# Patient Record
Sex: Female | Born: 1995 | Race: White | Hispanic: No | Marital: Single | Smoking: Never smoker
Health system: Southern US, Community
[De-identification: ages and names within clinical notes are randomized; demographics above are authoritative.]

## PROBLEM LIST (undated history)

## (undated) DIAGNOSIS — L309 Dermatitis, unspecified: Secondary | ICD-10-CM

---
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
## 2017-03-23 ENCOUNTER — Ambulatory Visit (INDEPENDENT_AMBULATORY_CARE_PROVIDER_SITE_OTHER): Payer: BLUE CROSS/BLUE SHIELD | Admitting: Family Medicine

## 2017-03-23 ENCOUNTER — Ambulatory Visit (FREE_STANDING_LABORATORY_FACILITY): Payer: BLUE CROSS/BLUE SHIELD | Admitting: Family Medicine

## 2017-03-23 ENCOUNTER — Encounter (INDEPENDENT_AMBULATORY_CARE_PROVIDER_SITE_OTHER): Payer: Self-pay | Admitting: Family Medicine

## 2017-03-23 VITALS — BP 112/72 | HR 73 | Temp 97.7°F | Ht 66.1 in | Wt 111.0 lb

## 2017-03-23 DIAGNOSIS — Z1389 Encounter for screening for other disorder: Secondary | ICD-10-CM

## 2017-03-23 DIAGNOSIS — Z0000 Encounter for general adult medical examination without abnormal findings: Secondary | ICD-10-CM

## 2017-03-23 DIAGNOSIS — Z1331 Encounter for screening for depression: Secondary | ICD-10-CM

## 2017-03-23 LAB — COMPREHENSIVE METABOLIC PANEL
ALT: 11 U/L (ref 0–55)
AST (SGOT): 17 U/L (ref 5–34)
Albumin/Globulin Ratio: 1.6 (ref 0.9–2.2)
Albumin: 5.1 g/dL — ABNORMAL HIGH (ref 3.5–5.0)
Alkaline Phosphatase: 84 U/L (ref 50–130)
BUN: 12 mg/dL (ref 7.0–19.0)
Bilirubin, Total: 0.5 mg/dL (ref 0.1–1.2)
CO2: 26 mEq/L (ref 21–29)
Calcium: 10.8 mg/dL — ABNORMAL HIGH (ref 8.5–10.5)
Chloride: 106 mEq/L (ref 100–111)
Creatinine: 0.8 mg/dL (ref 0.4–1.5)
Globulin: 3.1 g/dL (ref 2.0–3.7)
Glucose: 91 mg/dL (ref 70–100)
Potassium: 4.7 mEq/L (ref 3.5–5.1)
Protein, Total: 8.2 g/dL (ref 6.0–8.3)
Sodium: 142 mEq/L (ref 136–145)

## 2017-03-23 LAB — CBC AND DIFFERENTIAL
Absolute NRBC: 0 10*3/uL
Basophils Absolute Automated: 0.03 10*3/uL (ref 0.00–0.20)
Basophils Automated: 0.6 %
Eosinophils Absolute Automated: 0.07 10*3/uL (ref 0.00–0.70)
Eosinophils Automated: 1.5 %
Hematocrit: 47.6 % — ABNORMAL HIGH (ref 37.0–47.0)
Hgb: 15 g/dL (ref 12.0–16.0)
Immature Granulocytes Absolute: 0.01 10*3/uL
Immature Granulocytes: 0.2 %
Lymphocytes Absolute Automated: 1.46 10*3/uL (ref 0.50–4.40)
Lymphocytes Automated: 30.3 %
MCH: 28.7 pg (ref 28.0–32.0)
MCHC: 31.5 g/dL — ABNORMAL LOW (ref 32.0–36.0)
MCV: 91 fL (ref 80.0–100.0)
MPV: 11.9 fL (ref 9.4–12.3)
Monocytes Absolute Automated: 0.31 10*3/uL (ref 0.00–1.20)
Monocytes: 6.4 %
Neutrophils Absolute: 2.94 10*3/uL (ref 1.80–8.10)
Neutrophils: 61 %
Nucleated RBC: 0 /100 WBC (ref 0.0–1.0)
Platelets: 247 10*3/uL (ref 140–400)
RBC: 5.23 10*6/uL (ref 4.20–5.40)
RDW: 13 % (ref 12–15)
WBC: 4.82 10*3/uL (ref 3.50–10.80)

## 2017-03-23 LAB — LIPID PANEL
Cholesterol / HDL Ratio: 2.3
Cholesterol: 152 mg/dL (ref 0–199)
HDL: 65 mg/dL (ref 40–9999)
LDL Calculated: 76 mg/dL (ref 0–99)
Triglycerides: 55 mg/dL (ref 34–149)
VLDL Calculated: 11 mg/dL (ref 10–40)

## 2017-03-23 LAB — VITAMIN D,25 OH,TOTAL: Vitamin D, 25 OH, Total: 22 ng/mL — ABNORMAL LOW (ref 30–100)

## 2017-03-23 LAB — TSH: TSH: 1.07 u[IU]/mL (ref 0.35–4.94)

## 2017-03-23 LAB — HEMOLYSIS INDEX: Hemolysis Index: 18 (ref 0–18)

## 2017-03-23 NOTE — Progress Notes (Signed)
Have you seen any specialists/other providers since your last visit with us?    Yes    Arm preference verified?   Yes    The patient is due for  HPV.

## 2017-03-23 NOTE — Progress Notes (Signed)
Subjective:       Patient ID: Toni Duffy is a 21 y.o. female.    HPI  Pt presents to clinic for a CPE.  Pt has no concerns today.Toni Duffy eats a balanced diet .    The following portions of the patient's history were reviewed and updated as appropriate: allergies, current medications, past family history, past medical history, past social history, past surgical history and problem list.    Review of Systems   Constitutional: Negative for activity change, appetite change, fatigue and unexpected weight change.   HENT: Negative for congestion, postnasal drip and rhinorrhea.    Eyes: Negative for discharge and redness.   Respiratory: Negative for cough, shortness of breath and wheezing.    Cardiovascular: Negative for chest pain and palpitations.   Gastrointestinal: Negative for abdominal pain, constipation, diarrhea and vomiting.   Genitourinary: Negative for difficulty urinating, dysuria, vaginal bleeding and vaginal discharge.   Musculoskeletal: Negative for arthralgias and back pain.   Skin: Negative for rash.   Neurological: Negative for weakness, numbness and headaches.   Psychiatric/Behavioral: Negative for agitation, confusion and decreased concentration. The patient is not nervous/anxious.            Objective:    Physical Exam   Constitutional: She is oriented to person, place, and time. She appears well-developed and well-nourished.   HENT:   Head: Normocephalic and atraumatic.   Right Ear: External ear normal.   Left Ear: External ear normal.   Nose: Nose normal.   Mouth/Throat: Oropharynx is clear and moist.   Eyes: Conjunctivae and EOM are normal. Pupils are equal, round, and reactive to light.   Neck: Normal range of motion. Neck supple. No thyromegaly present.   Cardiovascular: Normal rate, regular rhythm, normal heart sounds and intact distal pulses.    Pulmonary/Chest: Effort normal and breath sounds normal.   Abdominal: Soft. Bowel sounds are normal. She exhibits no distension and no mass.  There is no tenderness.   Musculoskeletal: Normal range of motion. She exhibits no edema or tenderness.   Lymphadenopathy:     She has no cervical adenopathy.   Neurological: She is alert and oriented to person, place, and time. She has normal reflexes. She displays normal reflexes. No cranial nerve deficit. She exhibits normal muscle tone. Coordination normal.   Skin: Skin is warm and dry.   Psychiatric: She has a normal mood and affect. Her behavior is normal. Judgment and thought content normal.   Vitals reviewed.          Assessment:       1. Depression screening     2. Annual physical exam  Varicella vaccine subcutaneous    HPV 9 valent recombinant IM    Meningococcal conjugate vaccine 4-valent IM    CBC and differential    Comprehensive metabolic panel    TSH    Lipid panel    Vitamin D,25 OH, Total           Plan:      Procedures  Orders Placed This Encounter   Procedures   . Varicella vaccine subcutaneous   . HPV 9 valent recombinant IM   . Meningococcal conjugate vaccine 4-valent IM   . CBC and differential   . Comprehensive metabolic panel     Order Specific Question:   Has the patient fasted?     Answer:   Yes   . TSH   . Lipid panel     Order Specific Question:  Has the patient fasted?     Answer:   Yes   . Vitamin D,25 OH, Total   . Hemolysis index     Has the patient fasted?->Yes       Nutrition: Stressed importance of moderation in sodium/caffeine intake, saturated fat and cholesterol, caloric balance, sufficient intake of fresh fruits, vegetables, fiber, calcium, iron   Body mass index is 17.86 kg/m.  Discussed the patient's BMI with her.  The BMI is in the acceptable range. As per patient not losing any more weight, no loss of appetite, no abdominal pain    Also seen GI in the past for weight loss and as per patient was t old everything was normal, will get records.  Nutrition and Physical Activity Counseling:  Nutrition Counseling:   Food education, guidance and counseling  Physical Activity  Counseling   Patient advised about exercise      --Discussed the issue of calcium supplement, --Exercise: Stressed the importance of regular exercise, 30 minutes daily 5 times per week.   --Dental health: Discussed importance of regular tooth brushing, flossing, and dental visits.   --Immunizations reviewed.  Orders Placed This Encounter   Procedures   . Varicella vaccine subcutaneous   . HPV 9 valent recombinant IM   . Meningococcal conjugate vaccine 4-valent IM   . CBC and differential   . Comprehensive metabolic panel     Order Specific Question:   Has the patient fasted?     Answer:   Yes   . TSH   . Lipid panel     Order Specific Question:   Has the patient fasted?     Answer:   Yes   . Vitamin D,25 OH, Total   . Hemolysis index     Has the patient fasted?->Yes

## 2017-03-24 NOTE — Progress Notes (Signed)
Vitamin d low advise OTC vitamin d3-1000 IU daily.  Also calcium slightly high increase water intake to 6-8 glasses daily and repeat calcium in 2 months

## 2017-03-26 ENCOUNTER — Encounter (INDEPENDENT_AMBULATORY_CARE_PROVIDER_SITE_OTHER): Payer: Self-pay | Admitting: Family Medicine

## 2017-03-26 NOTE — Progress Notes (Signed)
I

## 2017-04-07 ENCOUNTER — Encounter (INDEPENDENT_AMBULATORY_CARE_PROVIDER_SITE_OTHER): Payer: Self-pay | Admitting: Family Medicine

## 2020-10-21 ENCOUNTER — Other Ambulatory Visit: Payer: Self-pay

## 2020-10-21 ENCOUNTER — Encounter: Payer: Self-pay | Admitting: Emergency Medicine

## 2020-10-21 ENCOUNTER — Emergency Department (INDEPENDENT_AMBULATORY_CARE_PROVIDER_SITE_OTHER)
Admission: EM | Admit: 2020-10-21 | Discharge: 2020-10-21 | Disposition: A | Discharge status: Home / Self Care | Payer: Federal, State, Local not specified - PPO | Source: Home / Self Care

## 2020-10-21 DIAGNOSIS — R21 Rash and other nonspecific skin eruption: Secondary | ICD-10-CM | POA: Diagnosis not present

## 2020-10-21 MED ORDER — CETIRIZINE HCL 10 MG PO TABS: 10.0000 mg | ORAL_TABLET | Freq: Every day | ORAL | 0 refills | Status: AC

## 2020-10-21 MED ORDER — PREDNISONE 20 MG PO TABS: ORAL_TABLET | ORAL | 0 refills | Status: DC

## 2020-10-21 NOTE — Discharge Instructions (Signed)
  Avoid using makeup and only use mild non-fragrance soap for your face.   Take medication as prescribed. Call to schedule a follow up with family medicine or dermatologist later in the week if not improving.

## 2020-10-21 NOTE — ED Provider Notes (Signed)
[facility]    CSN: [id number] Arrival date & time: 10/21/20  1301      History   Chief Complaint Chief Complaint  Patient presents with  . Rash    HPI Morgan Castaneda is a 25 y.o. female.   HPI Morgan Castaneda is a 25 y.o. female presenting to UC with c/o itchy burning red rash that has been waxing and waning for a few months around her eyes and lips.  She also has a red lesion that comes and goes on her forehead. She has tried several different facial lotions without relief. She has not tried any medications. Denies oral swelling or trouble breathing. No new soaps, lotions or makeup she recalls prior to symptom onset. No other rashes on her body.   History reviewed. No pertinent past medical history.  There are no problems to display for this patient.   History reviewed. No pertinent surgical history.  OB History   No obstetric history on file.      Home Medications    Prior to Admission medications   Medication Sig Start Date End Date Taking? Authorizing Provider  cetirizine (ZYRTEC) 10 MG tablet Take 1 tablet (10 mg total) by mouth daily. 10/21/20  Yes Lurene Shadow, PA-C  predniSONE (DELTASONE) 20 MG tablet 3 tabs po day one, then 2 po daily x 4 days 10/21/20  Yes Lurene Shadow, PA-C    Family History Family History  Problem Relation Age of Onset  . Healthy Mother   . Lung disease Father   . Glaucoma Father   . Healthy Brother     Social History Social History   Tobacco Use  . Smoking status: Never Smoker  . Smokeless tobacco: Never Used  Vaping Use  . Vaping Use: Never used  Substance Use Topics  . Alcohol use: Not Currently  . Drug use: Never     Allergies   Patient has no known allergies.   Review of Systems Review of Systems  Constitutional: Negative for chills and fever.  HENT: Positive for facial swelling (mild eyelids).   Eyes: Negative for pain, discharge, itching and visual disturbance.  Skin: Positive for color change  and rash. Negative for wound.  Neurological: Negative for headaches.     Physical Exam Triage Vital Signs ED Triage Vitals  Enc Vitals Group     BP 10/21/20 1400 118/87     Pulse Rate 10/21/20 1400 97     Resp 10/21/20 1400 16     Temp 10/21/20 1400 99 F (37.2 C)     Temp Source 10/21/20 1400 Oral     SpO2 10/21/20 1400 99 %     Weight 10/21/20 1404 110 lb (49.9 kg)     Height 10/21/20 1404 5\' 7"  (1.702 m)     Head Circumference --      Peak Flow --      Pain Score 10/21/20 1402 0     Pain Loc --      Pain Edu? --      Excl. in GC? --    No data found.  Updated Vital Signs BP 118/87 (BP Location: Right Arm)   Pulse 97   Temp 99 F (37.2 C) (Oral)   Resp 16   Ht 5\' 7"  (1.702 m)   Wt 110 lb (49.9 kg)   LMP 10/01/2020 (Exact Date)   SpO2 99%   BMI 17.23 kg/m   Visual Acuity Right Eye Distance: 20/20 Left Eye Distance: 20/20 Bilateral  Distance: 20/20  Right Eye Near:   Left Eye Near:    Bilateral Near:     Physical Exam Vitals and nursing note reviewed.  Constitutional:      General: She is not in acute distress.    Appearance: Normal appearance. She is well-developed and well-nourished. She is not ill-appearing, toxic-appearing or diaphoretic.  HENT:     Head: Normocephalic and atraumatic.      Right Ear: Tympanic membrane and ear canal normal.     Left Ear: Tympanic membrane and ear canal normal.     Nose: Nose normal.     Right Sinus: No maxillary sinus tenderness or frontal sinus tenderness.     Left Sinus: No maxillary sinus tenderness or frontal sinus tenderness.     Mouth/Throat:     Lips: Pink.     Mouth: Mucous membranes are moist.     Pharynx: Oropharynx is clear. Uvula midline. No pharyngeal swelling, oropharyngeal exudate, posterior oropharyngeal erythema or uvula swelling.  Eyes:     Extraocular Movements: Extraocular movements intact and EOM normal.     Conjunctiva/sclera: Conjunctivae normal.     Pupils: Pupils are equal, round, and  reactive to light.  Cardiovascular:     Rate and Rhythm: Normal rate.  Pulmonary:     Effort: Pulmonary effort is normal. No respiratory distress.  Musculoskeletal:        General: Normal range of motion.     Cervical back: Normal range of motion.  Skin:    General: Skin is warm and dry.     Findings: Rash (facial only) present.  Neurological:     Mental Status: She is alert and oriented to person, place, and time.  Psychiatric:        Mood and Affect: Mood and affect normal.        Behavior: Behavior normal.      UC Treatments / Results  Labs (all labs ordered are listed, but only abnormal results are displayed) Labs Reviewed - No data to display  EKG   Radiology No results found.  Procedures Procedures (including critical care time)  Medications Ordered in UC Medications - No data to display  Initial Impression / Assessment and Plan / UC Course  I have reviewed the triage vital signs and the nursing notes.  Pertinent labs & imaging results that were available during my care of the patient were reviewed by me and considered in my medical decision making (see chart for details).     Chronic itchy burning red facial rash Will tx for allergic reaction Encouraged f/u with PCP and/or dermatology later in the week if medication not improving.   Final Clinical Impressions(s) / UC Diagnoses   Final diagnoses:  Rash and nonspecific skin eruption     Discharge Instructions      Avoid using makeup and only use mild non-fragrance soap for your face.   Take medication as prescribed. Call to schedule a follow up with family medicine or dermatologist later in the week if not improving.    ED Prescriptions    Medication Sig Dispense Auth. Provider   predniSONE (DELTASONE) 20 MG tablet 3 tabs po day one, then 2 po daily x 4 days 11 tablet Jeronica Stlouis O, PA-C   cetirizine (ZYRTEC) 10 MG tablet Take 1 tablet (10 mg total) by mouth daily. 30 tablet Lurene Shadow,  [place]     PDMP not reviewed this encounter.   Lurene Shadow, [place] 10/21/20 1539

## 2020-10-21 NOTE — ED Triage Notes (Signed)
Rash to face & eyelids over the last few months No new lotions, detergent, foods or animals  Pt does not wear make up  [employer] vaccine July 2021

## 2020-12-30 ENCOUNTER — Emergency Department (HOSPITAL_BASED_OUTPATIENT_CLINIC_OR_DEPARTMENT_OTHER)
Admission: EM | Admit: 2020-12-30 | Discharge: 2020-12-30 | Disposition: A | Discharge status: Home / Self Care | Payer: Federal, State, Local not specified - PPO | Attending: Emergency Medicine | Admitting: Emergency Medicine

## 2020-12-30 ENCOUNTER — Other Ambulatory Visit: Payer: Self-pay

## 2020-12-30 ENCOUNTER — Encounter (HOSPITAL_BASED_OUTPATIENT_CLINIC_OR_DEPARTMENT_OTHER): Payer: Self-pay | Admitting: *Deleted

## 2020-12-30 ENCOUNTER — Emergency Department (HOSPITAL_BASED_OUTPATIENT_CLINIC_OR_DEPARTMENT_OTHER): Payer: Federal, State, Local not specified - PPO

## 2020-12-30 DIAGNOSIS — R1031 Right lower quadrant pain: Secondary | ICD-10-CM | POA: Diagnosis present

## 2020-12-30 DIAGNOSIS — R102 Pelvic and perineal pain: Secondary | ICD-10-CM | POA: Diagnosis not present

## 2020-12-30 HISTORY — DX: Dermatitis, unspecified: L30.9

## 2020-12-30 LAB — URINALYSIS, ROUTINE W REFLEX MICROSCOPIC
Bilirubin Urine: NEGATIVE
Glucose, UA: NEGATIVE mg/dL
Ketones, ur: 15 mg/dL — AB
Leukocytes,Ua: NEGATIVE
Nitrite: NEGATIVE
Protein, ur: NEGATIVE mg/dL
Specific Gravity, Urine: 1.025 (ref 1.005–1.030)
pH: 6.5 (ref 5.0–8.0)

## 2020-12-30 LAB — URINALYSIS, MICROSCOPIC (REFLEX): RBC / HPF: >50 RBC/hpf (ref 0–5)

## 2020-12-30 LAB — CBC WITH DIFFERENTIAL/PLATELET
Abs Immature Granulocytes: 0.02 10*3/uL (ref 0.00–0.07)
Basophils Absolute: 0 10*3/uL (ref 0.0–0.1)
Basophils Relative: 1 %
Eosinophils Absolute: 0 10*3/uL (ref 0.0–0.5)
Eosinophils Relative: 0 %
HCT: 38.8 % (ref 36.0–46.0)
Hemoglobin: 13.3 g/dL (ref 12.0–15.0)
Immature Granulocytes: 0 %
Lymphocytes Relative: 19 %
Lymphs Abs: 1.2 10*3/uL (ref 0.7–4.0)
MCH: 28.7 pg (ref 26.0–34.0)
MCHC: 34.3 g/dL (ref 30.0–36.0)
MCV: 83.6 fL (ref 80.0–100.0)
Monocytes Absolute: 0.3 10*3/uL (ref 0.1–1.0)
Monocytes Relative: 5 %
Neutro Abs: 4.7 10*3/uL (ref 1.7–7.7)
Neutrophils Relative %: 75 %
Platelets: 243 10*3/uL (ref 150–400)
RBC: 4.64 MIL/uL (ref 3.87–5.11)
RDW: 13.1 % (ref 11.5–15.5)
WBC: 6.2 10*3/uL (ref 4.0–10.5)
nRBC: 0 % (ref 0.0–0.2)

## 2020-12-30 LAB — COMPREHENSIVE METABOLIC PANEL
ALT: 24 U/L (ref 0–44)
AST: 26 U/L (ref 15–41)
Albumin: 4.8 g/dL (ref 3.5–5.0)
Alkaline Phosphatase: 65 U/L (ref 38–126)
Anion gap: 10 (ref 5–15)
BUN: 9 mg/dL (ref 6–20)
CO2: 22 mmol/L (ref 22–32)
Calcium: 9.3 mg/dL (ref 8.9–10.3)
Chloride: 104 mmol/L (ref 98–111)
Creatinine, Ser: 0.81 mg/dL (ref 0.44–1.00)
GFR, Estimated: >60 mL/min (ref >60)
Glucose, Bld: 129 mg/dL — ABNORMAL HIGH (ref 70–99)
Potassium: 3.5 mmol/L (ref 3.5–5.1)
Sodium: 136 mmol/L (ref 135–145)
Total Bilirubin: 0.8 mg/dL (ref 0.3–1.2)
Total Protein: 7.6 g/dL (ref 6.5–8.1)

## 2020-12-30 LAB — LIPASE, BLOOD: Lipase: 25 U/L (ref 11–51)

## 2020-12-30 LAB — PREGNANCY, URINE: Preg Test, Ur: NEGATIVE

## 2020-12-30 MED ORDER — ONDANSETRON 4 MG PO TBDP: 4.0000 mg | ORAL_TABLET | Freq: Three times a day (TID) | ORAL | 0 refills | Status: DC | PRN

## 2020-12-30 MED ORDER — KETOROLAC TROMETHAMINE 30 MG/ML IJ SOLN
30.0000 mg | Freq: Once | INTRAMUSCULAR | Status: AC
  Administered 2020-12-30: 30 mg via INTRAVENOUS
  Filled 2020-12-30: qty 1

## 2020-12-30 MED ORDER — CEPHALEXIN 250 MG PO CAPS
500.0000 mg | ORAL_CAPSULE | Freq: Once | ORAL | Status: AC
  Administered 2020-12-30: 500 mg via ORAL
  Filled 2020-12-30: qty 2

## 2020-12-30 MED ORDER — CEPHALEXIN 500 MG PO CAPS: 500.0000 mg | ORAL_CAPSULE | Freq: Three times a day (TID) | ORAL | 0 refills | Status: AC

## 2020-12-30 NOTE — ED Notes (Signed)
Spoke with Carilla in lab to add on urine culture

## 2020-12-30 NOTE — Discharge Instructions (Signed)
Please call Dr. Lindajo Royal with [facility] to schedule appointment for ongoing evaluation and management of your suspected kidney stone.  The stone has already appeared to have passed into your bladder, per ultrasound.  Please take the Keflex antibiotics, as prescribed.  I would also like for you to take Zofran ODT as needed for any nausea symptoms.  Take ibuprofen 600 mg every 6 hours as needed for pain control.  It is vitally important that you drink plenty of fluids to help clear any infection and help facilitate passage of the stone.  Your pain symptoms may continue to wax and wane over the course of the next day.  Please return to the ED or seek immediate medical attention should you experience any new or worsening symptoms.

## 2020-12-30 NOTE — ED Triage Notes (Signed)
States a few hours ago began having RLQ abd pain, vomiting approx 6-7 times per her statement, no diarrhea. Unable to take in PO's. Denies any abd surgeries. States not pregnant.

## 2020-12-30 NOTE — ED Notes (Signed)
ED Provider at bedside. 

## 2020-12-30 NOTE — ED Provider Notes (Signed)
[facility] EMERGENCY DEPARTMENT Provider Note   CSN: [id number] Arrival date & time: 12/30/20  1540     History Chief Complaint  Patient presents with  . Abdominal Pain    Morgan Castaneda is a 25 y.o. female with no relevant past medical history presents the ED company by her boyfriend for right lower quadrant abdominal pain.  Patient reports that approximately 2 hours prior to arrival, she developed sudden onset right lower quadrant abdominal pain that came out of nowhere.  She states that she was feeling perfectly fine, but then felt 10 out of 10 pain that caused her to feel nauseated and throw up.  She states that her pain symptoms were initially constant, but now waxing and waning.  No significant pain at present.  This is never happened before.  She denies any significant past medical history or history of abdominal surgeries.    She denies any fevers, chills, current nausea, chest pain or shortness of breath, changes in her bowel habits, urinary symptoms, or possibility of pregnancy.  She is currently at the end of her menses.  Denies dyspareunia or recent vaginal discharge.  HPI     Past Medical History:  Diagnosis Date  . Eczema     There are no problems to display for this patient.   History reviewed. No pertinent surgical history.   OB History   No obstetric history on file.     Family History  Problem Relation Age of Onset  . Healthy Mother   . Lung disease Father   . Glaucoma Father   . Healthy Brother     Social History   Tobacco Use  . Smoking status: Never Smoker  . Smokeless tobacco: Never Used  Vaping Use  . Vaping Use: Never used  Substance Use Topics  . Alcohol use: Not Currently  . Drug use: Never    Home Medications Prior to Admission medications   Medication Sig Start Date End Date Taking? Authorizing Provider  cephALEXin (KEFLEX) 500 MG capsule Take 1 capsule (500 mg total) by mouth 3 (three) times daily for 5 days. 12/30/20  01/04/21 Yes Lorelee New, PA-C  cetirizine (ZYRTEC) 10 MG tablet Take 1 tablet (10 mg total) by mouth daily. 10/21/20  Yes Phelps, Erin O, PA-C  ondansetron (ZOFRAN ODT) 4 MG disintegrating tablet Take 1 tablet (4 mg total) by mouth every 8 (eight) hours as needed for nausea or vomiting. 12/30/20  Yes Lorelee New, PA-C  predniSONE (DELTASONE) 20 MG tablet 3 tabs po day one, then 2 po daily x 4 days 10/21/20   Lurene Shadow, PA-C    Allergies    Patient has no known allergies.  Review of Systems   Review of Systems  All other systems reviewed and are negative.   Physical Exam Updated Vital Signs BP 103/65 (BP Location: Left Arm)   Pulse 87   Temp 98.6 F (37 C) (Oral)   Resp 20   Ht 5\' 7"  (1.702 m)   Wt 49.9 kg   LMP 12/23/2020   SpO2 100%   BMI 17.23 kg/m   Physical Exam Vitals and nursing note reviewed. Exam conducted with a chaperone present.  Constitutional:      General: She is not in acute distress.    Appearance: She is not toxic-appearing.  HENT:     Head: Normocephalic and atraumatic.  Eyes:     General: No scleral icterus.    Conjunctiva/sclera: Conjunctivae normal.  Cardiovascular:  Rate and Rhythm: Normal rate and regular rhythm.     Pulses: Normal pulses.  Pulmonary:     Effort: Pulmonary effort is normal. No respiratory distress.     Breath sounds: Normal breath sounds.  Abdominal:     General: Abdomen is flat. There is no distension.     Palpations: Abdomen is soft.     Tenderness: There is abdominal tenderness. There is no guarding.     Comments: Soft, nondistended.  TTP in right lower quadrant, near McBurney's point.  Most notable in right-sided pelvic region.  No guarding.  No overlying skin changes.  Musculoskeletal:     Right lower leg: No edema.     Left lower leg: No edema.  Skin:    General: Skin is dry.  Neurological:     Mental Status: She is alert and oriented to person, place, and time.     GCS: GCS eye subscore is 4. GCS  verbal subscore is 5. GCS motor subscore is 6.  Psychiatric:        Mood and Affect: Mood normal.        Behavior: Behavior normal.        Thought Content: Thought content normal.     ED Results / Procedures / Treatments   Labs (all labs ordered are listed, but only abnormal results are displayed) Labs Reviewed  URINALYSIS, ROUTINE W REFLEX MICROSCOPIC - Abnormal; Notable for the following components:      Result Value   Hgb urine dipstick LARGE (*)    Ketones, ur 15 (*)    All other components within normal limits  COMPREHENSIVE METABOLIC PANEL - Abnormal; Notable for the following components:   Glucose, Bld 129 (*)    All other components within normal limits  URINALYSIS, MICROSCOPIC (REFLEX) - Abnormal; Notable for the following components:   Bacteria, UA MANY (*)    All other components within normal limits  URINE CULTURE  PREGNANCY, URINE  LIPASE, BLOOD  CBC WITH DIFFERENTIAL/PLATELET    EKG None  Radiology US PELVIC COMPLETE W TRANSVAGINAL AND TORSION R/O  Result Date: 12/30/2020 CLINICAL DATA:  Right lower quadrant pain.  Nausea and vomiting. EXAM: TRANSABDOMINAL AND TRANSVAGINAL ULTRASOUND OF PELVIS DOPPLER ULTRASOUND OF OVARIES TECHNIQUE: Both transabdominal and transvaginal ultrasound examinations of the pelvis were performed. Transabdominal technique was performed for global imaging of the pelvis including uterus, ovaries, adnexal regions, and pelvic cul-de-sac. It was necessary to proceed with endovaginal exam following the transabdominal exam to visualize the endometrium and ovaries. Color and duplex Doppler ultrasound was utilized to evaluate blood flow to the ovaries. COMPARISON:  None. FINDINGS: Uterus Measurements: 8.1 x 3.7 x 4.7 cm = volume: 72.4 mL. No fibroids or other mass visualized. Endometrium Thickness: 4 mm. There is a small amount of fluid in the cervical canal. Right ovary Measurements: 3.2 x 2.7 x 2.5 cm = volume: 11.0 mL. Contains a normal 1.5 cm  follicle. There is a 1.4 x 0.9 x 1.1 cm cystic structure between the right ovary and the uterus which could either be paraovarian or arise from the ovary. Regardless, it is of no significance. Left ovary Measurements: 2.6 x 1.6 x 2.0 cm = volume: 4.4 mL. Normal appearance/no adnexal mass. Pulsed Doppler evaluation of both ovaries demonstrates normal low-resistance arterial and venous waveforms. Other findings There is an echogenic focus measuring 5 mm in the urinary bladder on the transabdominal exam. IMPRESSION: 1. There is a 5 mm echogenic focus in the bladder. A recently passed stone  is a possibility. 2. There is a 1.4 cm cyst in the right adnexa which may arise from the right ovary or represent a para ovarian simple cyst. Regardless, it is of no significance. 3. No other abnormalities. Electronically Signed   By: Gerome Sam III M.D   On: 12/30/2020 18:14    Procedures Procedures   Medications Ordered in ED Medications  ketorolac (TORADOL) 30 MG/ML injection 30 mg (30 mg Intravenous Given 12/30/20 1836)  cephALEXin (KEFLEX) capsule 500 mg (500 mg Oral Given 12/30/20 1834)    ED Course  I have reviewed the triage vital signs and the nursing notes.  Pertinent labs & imaging results that were available during my care of the patient were reviewed by me and considered in my medical decision making (see chart for details).    MDM Rules/Calculators/A&P                          Morgan Castaneda was evaluated in Emergency Department on 12/30/2020 for the symptoms described in the history of present illness. She was evaluated in the context of the global COVID-19 pandemic, which necessitated consideration that the patient might be at risk for infection with the SARS-CoV-2 virus that causes COVID-19. Institutional protocols and algorithms that pertain to the evaluation of patients at risk for COVID-19 are in a state of rapid change based on information released by regulatory bodies including the CDC and  federal and state organizations. These policies and algorithms were followed during the patient's care in the ED.  I personally reviewed patient's medical chart and all notes from triage and staff during today's encounter. I have also ordered and reviewed all labs and imaging that I felt to be medically necessary in the evaluation of this patient's complaints and with consideration of their physical exam. If needed, translation services were available and utilized.   Differential includes but not limited to ruptured ovarian cyst, ovarian torsion, obstructing ureterolithiasis, and appendicitis.  Will obtain basic laboratory work-up and then proceed with transvaginal ultrasound to assess for ovarian torsion.  Laboratory work-up is obtained and largely unremarkable.  No leukocytosis concerning for infection.  CMP without renal impairment or electrolyte derangement.    UA with blood, but to be expected in the context of menses.  However, there are also many bacteria and budding yeast present.  Ultrasound is reviewed and demonstrates a 5 mm hypoechoic focus in the bladder suggestive of recently passed stone.  This fits the clinical picture of acute onset 10 out of 10 pain with nausea and emesis.  Her symptoms have been well controlled throughout her duration here in the ED.  Will provide Toradol IV given after shock cramping discomfort, however she is well controlled from a pain standpoint and from a nausea standpoint.  We will discharge her home with Keflex antibiotics given possible cystitis and UA and encourage continued ibuprofen 600 mg every 6 hours as needed for pain control.  We will also provide Zofran ODT should she develop any nausea symptoms and encourage her to drink plenty of water.  Will provide referral to urology for ongoing evaluation and management.  Discussed with Dr. Donnald Garre who agrees with plan.  ED return precautions discussed.  Patient voices understanding and is agreeable to the plan.      Final Clinical Impression(s) / ED Diagnoses Final diagnoses:  Pelvic pain in female    Rx / DC Orders ED Discharge Orders  Ordered    cephALEXin (KEFLEX) 500 MG capsule  3 times daily        12/30/20 1830    ondansetron (ZOFRAN ODT) 4 MG disintegrating tablet  Every 8 hours PRN        12/30/20 1919           Elvera Maria 12/30/20 Christie Beckers, MD 01/09/21 [phone]

## 2021-01-01 LAB — URINE CULTURE: Culture: NO GROWTH

## 2022-06-01 ENCOUNTER — Ambulatory Visit
Admission: RE | Admit: 2022-06-01 | Discharge: 2022-06-01 | Disposition: A | Discharge status: Home / Self Care | Payer: Federal, State, Local not specified - PPO | Source: Ambulatory Visit | Attending: Family Medicine | Admitting: Family Medicine

## 2022-06-01 VITALS — BP 120/83 | HR 90 | Temp 99.0°F | Resp 18 | Ht 67.0 in | Wt 125.0 lb

## 2022-06-01 DIAGNOSIS — R0981 Nasal congestion: Secondary | ICD-10-CM

## 2022-06-01 DIAGNOSIS — G44209 Tension-type headache, unspecified, not intractable: Secondary | ICD-10-CM | POA: Diagnosis not present

## 2022-06-01 MED ORDER — FLUTICASONE PROPIONATE 50 MCG/ACT NA SUSP: 2.0000 | Freq: Every day | NASAL | 0 refills | Status: AC

## 2022-06-01 MED ORDER — TIZANIDINE HCL 4 MG PO TABS: 4.0000 mg | ORAL_TABLET | Freq: Four times a day (QID) | ORAL | 0 refills | Status: AC | PRN

## 2022-06-01 MED ORDER — NAPROXEN SODIUM 550 MG PO TABS: 550.0000 mg | ORAL_TABLET | Freq: Two times a day (BID) | ORAL | 0 refills | Status: AC

## 2022-06-01 NOTE — Discharge Instructions (Signed)
Take naproxen 2 times a day with food. Take tizanidine (muscle relaxer) at bedtime.  May take during the day if needed I have prescribed Flonase for the nasal congestion symptoms Call or return if not improving by next week

## 2022-06-01 NOTE — ED Provider Notes (Signed)
[facility]    CSN: [id number] Arrival date & time: 06/01/22  1107      History   Chief Complaint Chief Complaint  Patient presents with   Nasal Congestion    I believe I have a swollen and painful lymph node on the back of my head. Also a headache. - Entered by patient   Appointment    HPI Morgan Castaneda is a 26 y.o. female.   HPI  Patient has pain at the back of her head.  She states a swollen node in this area.  Headaches.  This has been going on 2 to 3 days.  She has also had nasal congestion and drainage for couple of weeks.  No fever or chills.  No sore throat.  No cough.  Environmental allergies.  No fever chills or body aches  Past Medical History:  Diagnosis Date   Eczema     There are no problems to display for this patient.   History reviewed. No pertinent surgical history.  OB History   No obstetric history on file.      Home Medications    Prior to Admission medications   Medication Sig Start Date End Date Taking? Authorizing Provider  cetirizine (ZYRTEC) 10 MG tablet Take 1 tablet (10 mg total) by mouth daily. 10/21/20  Yes Phelps, Erin O, PA-C  escitalopram (LEXAPRO) 20 MG tablet Take 20 mg by mouth daily. 05/26/22  Yes [provider]  fluticasone (FLONASE) 50 MCG/ACT nasal spray Place 2 sprays into both nostrils daily. 06/01/22  Yes Raylene Everts, MD  naproxen sodium (ANAPROX DS) 550 MG tablet Take 1 tablet (550 mg total) by mouth 2 (two) times daily with a meal. 06/01/22  Yes Raylene Everts, MD  tiZANidine (ZANAFLEX) 4 MG tablet Take 1-2 tablets (4-8 mg total) by mouth every 6 (six) hours as needed for muscle spasms. 06/01/22  Yes Raylene Everts, MD    Family History Family History  Problem Relation Age of Onset   Healthy Mother    Lung disease Father    Glaucoma Father    Healthy Brother     Social History Social History   Tobacco Use   Smoking status: Never   Smokeless tobacco: Never  Vaping Use   Vaping  Use: Never used  Substance Use Topics   Alcohol use: Not Currently   Drug use: Never     Allergies   Patient has no known allergies.   Review of Systems Review of Systems See HPI  Physical Exam Triage Vital Signs ED Triage Vitals  Enc Vitals Group     BP 06/01/22 1117 120/83     Pulse Rate 06/01/22 1117 90     Resp 06/01/22 1117 18     Temp 06/01/22 1117 99 F (37.2 C)     Temp Source 06/01/22 1117 Oral     SpO2 06/01/22 1117 97 %     Weight 06/01/22 1118 125 lb (56.7 kg)     Height 06/01/22 1118 5\' 7"  (1.702 m)     Head Circumference --      Peak Flow --      Pain Score 06/01/22 1118 7     Pain Loc --      Pain Edu? --      Excl. in [place]? --    No data found.  Updated Vital Signs BP 120/83 (BP Location: Right Arm)   Pulse 90   Temp 99 F (37.2 C) (Oral)  Resp 18   Ht 5\' 7"  (1.702 m)   Wt 56.7 kg   LMP 05/07/2022   SpO2 97%   BMI 19.58 kg/m      Physical Exam Constitutional:      General: She is not in acute distress.    Appearance: She is well-developed.  HENT:     Head: Normocephalic and atraumatic.     Right Ear: Tympanic membrane and ear canal normal.     Left Ear: Tympanic membrane and ear canal normal.     Nose: No congestion or rhinorrhea.     Mouth/Throat:     Mouth: Mucous membranes are moist.     Pharynx: No posterior oropharyngeal erythema.  Eyes:     Conjunctiva/sclera: Conjunctivae normal.     Pupils: Pupils are equal, round, and reactive to light.  Neck:     Comments: The patient points to an area where she states she has a swollen node but I do not palpate any node.  She does have tenderness just to the occiput where the left paraspinous muscles attach.  There may be a little swelling in this area.  Again no masses appreciated Cardiovascular:     Rate and Rhythm: Normal rate.  Pulmonary:     Effort: Pulmonary effort is normal. No respiratory distress.  Abdominal:     General: There is no distension.     Palpations: Abdomen is  soft.  Musculoskeletal:        General: Normal range of motion.     Cervical back: Normal range of motion.  Lymphadenopathy:     Cervical: No cervical adenopathy.  Skin:    General: Skin is warm and dry.  Neurological:     Mental Status: She is alert.  Psychiatric:        Mood and Affect: Mood normal.        Behavior: Behavior normal.      UC Treatments / Results  Labs (all labs ordered are listed, but only abnormal results are displayed) Labs Reviewed - No data to display  EKG   Radiology No results found.  Procedures Procedures (including critical care time)  Medications Ordered in UC Medications - No data to display  Initial Impression / Assessment and Plan / UC Course  I have reviewed the triage vital signs and the nursing notes.  Pertinent labs & imaging results that were available during my care of the patient were reviewed by me and considered in my medical decision making (see chart for details).     Final Clinical Impressions(s) / UC Diagnoses   Final diagnoses:  Muscle tension headache  Nasal congestion     Discharge Instructions      Take naproxen 2 times a day with food. Take tizanidine (muscle relaxer) at bedtime.  May take during the day if needed I have prescribed Flonase for the nasal congestion symptoms Call or return if not improving by next week   ED Prescriptions     Medication Sig Dispense Auth. Provider   fluticasone (FLONASE) 50 MCG/ACT nasal spray Place 2 sprays into both nostrils daily. 16 g 07/07/2022, MD   naproxen sodium (ANAPROX DS) 550 MG tablet Take 1 tablet (550 mg total) by mouth 2 (two) times daily with a meal. 20 tablet Eustace Moore, MD   tiZANidine (ZANAFLEX) 4 MG tablet Take 1-2 tablets (4-8 mg total) by mouth every 6 (six) hours as needed for muscle spasms. 21 tablet Eustace Moore, MD  PDMP not reviewed this encounter.   Eustace Moore, MD 06/01/22 1228

## 2022-06-01 NOTE — ED Triage Notes (Signed)
Patient c/o a swollen lymph node on the back of the left side of her neck x 2 days.  It's becoming tender to touch causing headaches.  Nasal congestion and drainage.  Patient is taken Sudafed.

## 2022-06-15 IMAGING — US US PELVIS COMPLETE TRANSABD/TRANSVAG W DUPLEX
1 series · 13 of 25 positions shown · non-contrast
Comparison: None.

CLINICAL DATA: Right lower quadrant pain.  Nausea and vomiting.

EXAM:
TRANSABDOMINAL AND TRANSVAGINAL ULTRASOUND OF PELVIS
DOPPLER ULTRASOUND OF OVARIES
TECHNIQUE: Both transabdominal and transvaginal ultrasound examinations of the
pelvis were performed. Transabdominal technique was performed for
global imaging of the pelvis including uterus, ovaries, adnexal
regions, and pelvic cul-de-sac.
It was necessary to proceed with endovaginal exam following the
transabdominal exam to visualize the endometrium and ovaries. Color
and duplex Doppler ultrasound was utilized to evaluate blood flow to
the ovaries.

[Series 1: us pelvis complete transabd/transvag w duplex · 13 of 74 slices shown]
[im 1/74]
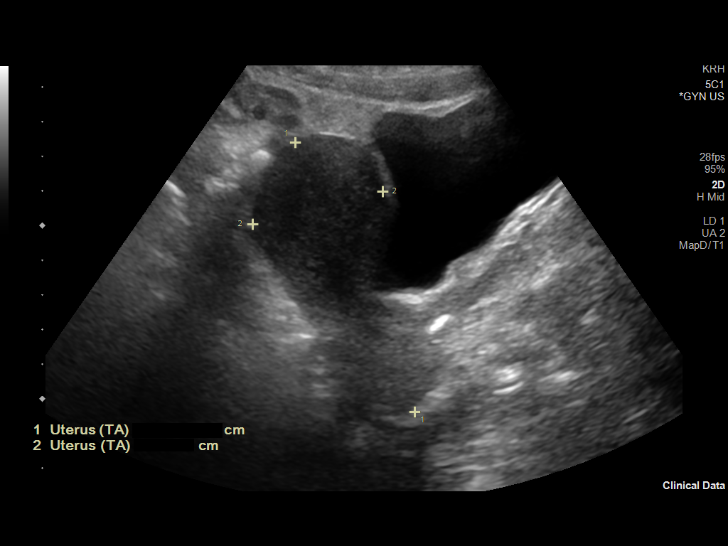
[im 7/74]
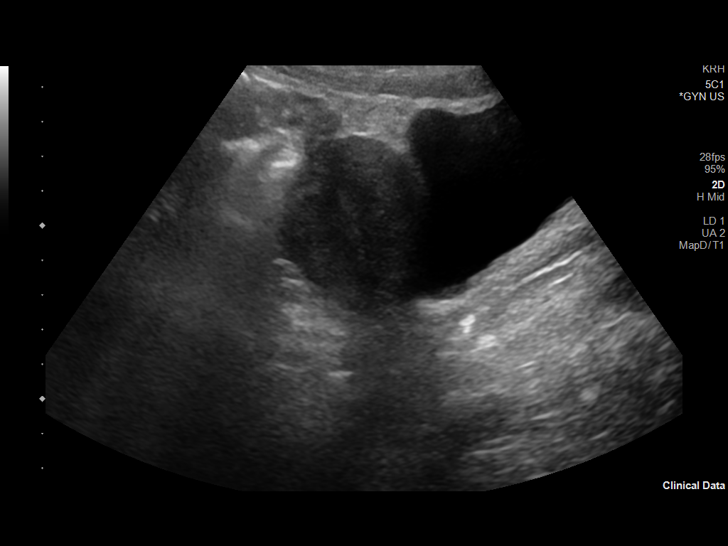
[im 13/74]
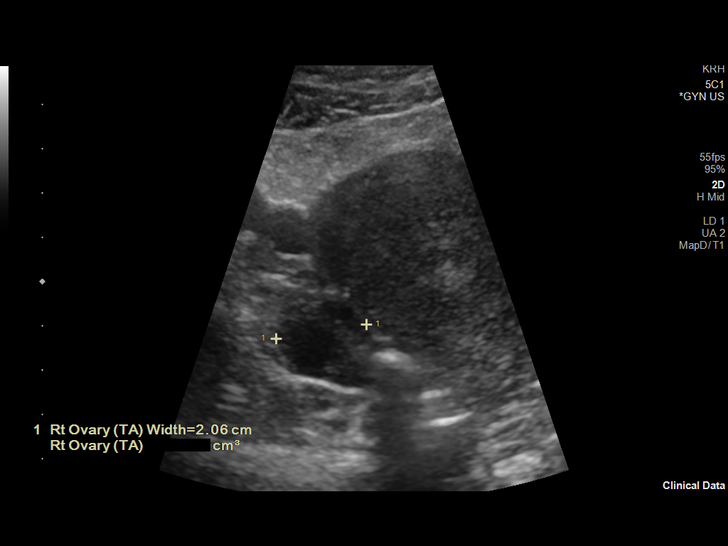
[im 19/74]
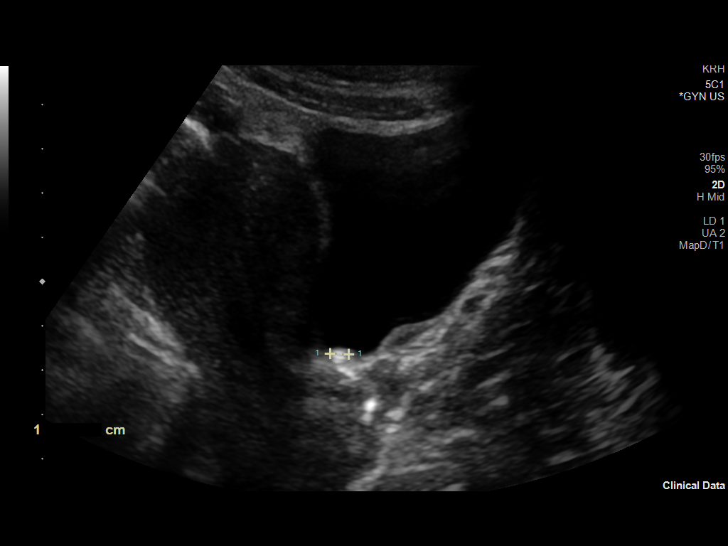
[im 25/74]
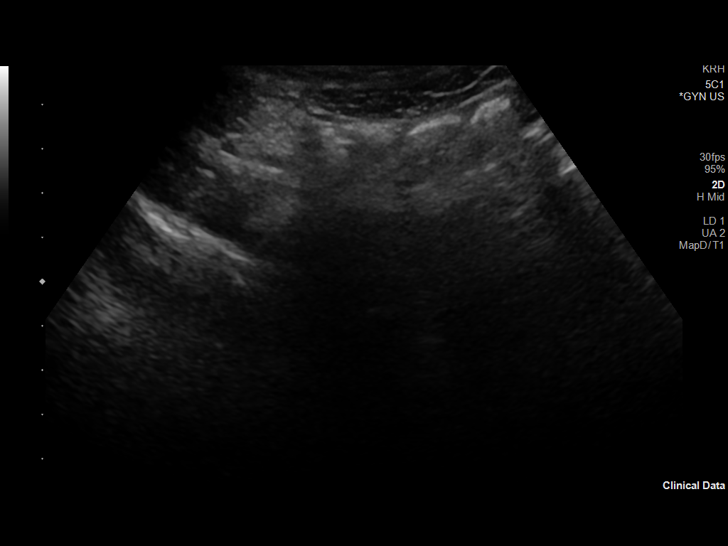
[im 31/74]
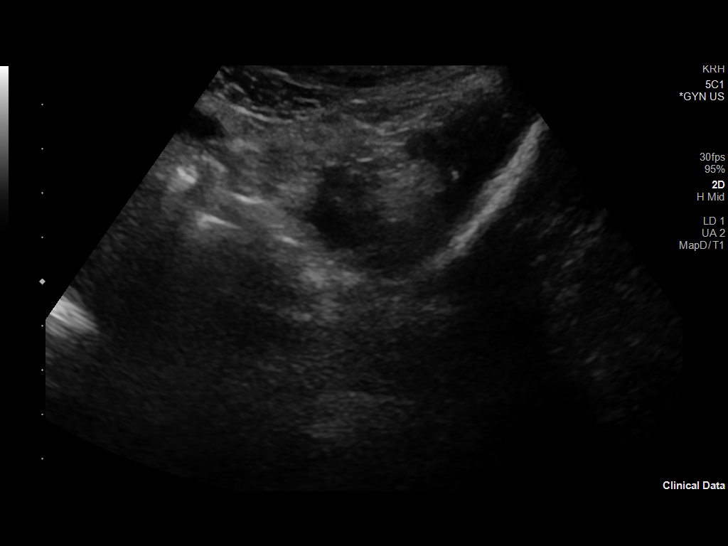
[im 37/74]
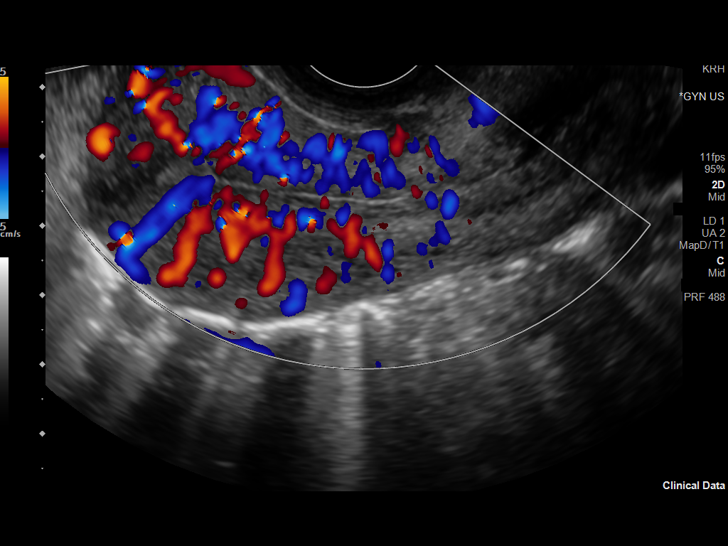
[im 43/74]
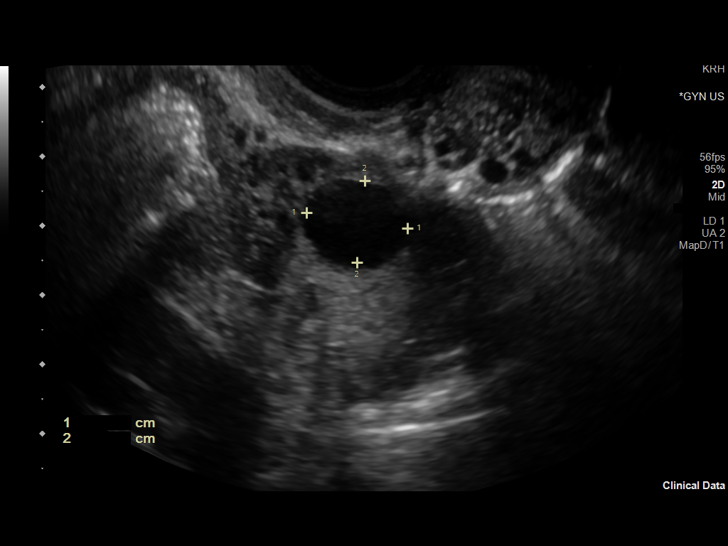
[im 49/74]
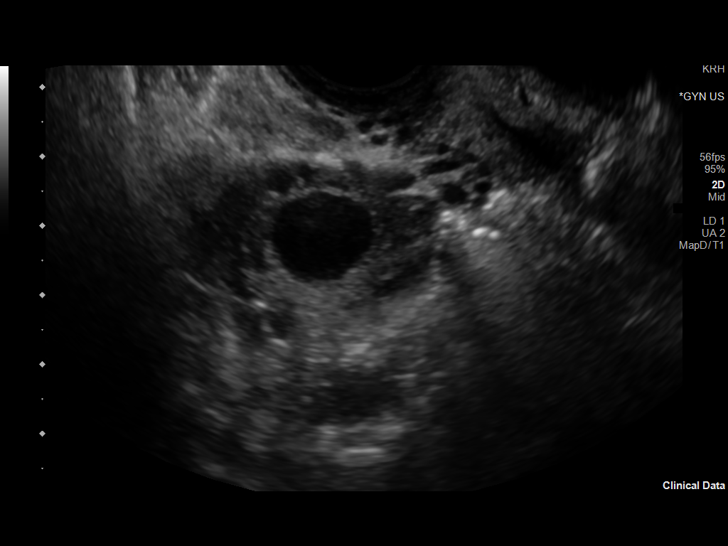
[im 55/74]
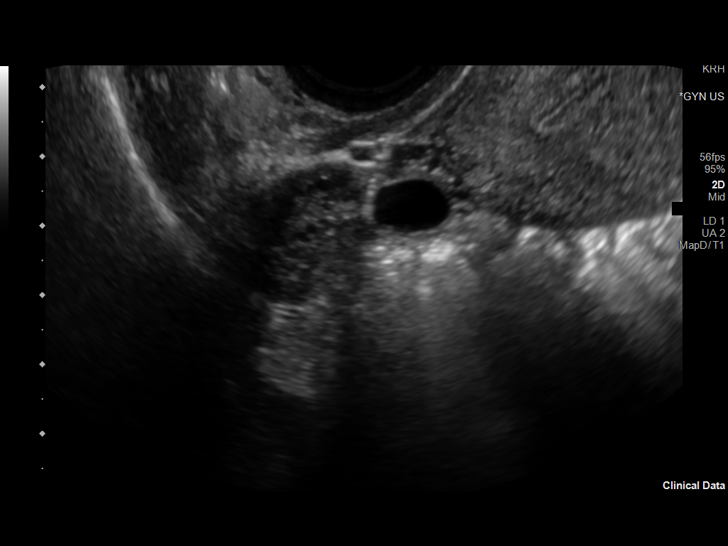
[im 61/74]
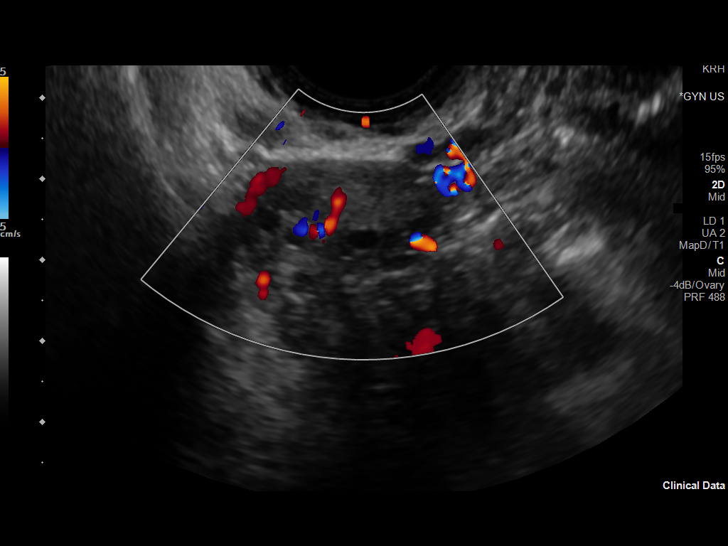
[im 67/74]
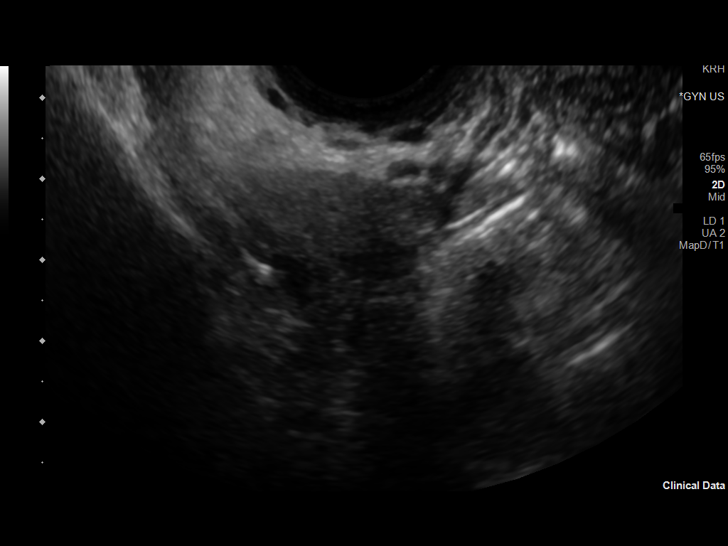
[im 74/74]
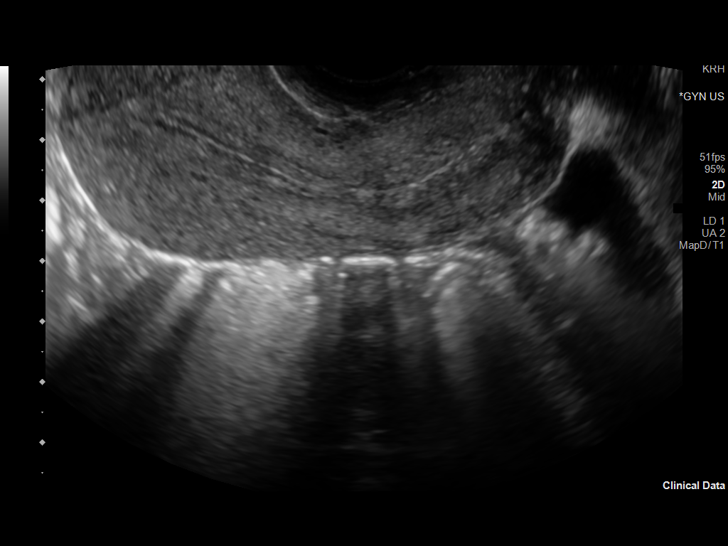

[13 of 25 positions shown; findings below may reference images not displayed]

FINDINGS: Uterus

Measurements: 8.1 x 3.7 x 4.7 cm = volume: 72.4 mL. No fibroids or
other mass visualized.

Endometrium

Thickness: 4 mm. There is a small amount of fluid in the cervical
canal.

Right ovary

Measurements: 3.2 x 2.7 x 2.5 cm = volume: 11.0 mL. Contains a
normal 1.5 cm follicle. There is a 1.4 x 0.9 x 1.1 cm cystic
structure between the right ovary and the uterus which could either
be paraovarian or arise from the ovary. Regardless, it is of no
significance.

Left ovary

Measurements: 2.6 x 1.6 x 2.0 cm = volume: 4.4 mL. Normal
appearance/no adnexal mass.

Pulsed Doppler evaluation of both ovaries demonstrates normal
low-resistance arterial and venous waveforms.

Other findings

There is an echogenic focus measuring 5 mm in the urinary bladder on
the transabdominal exam.
IMPRESSION: 1. There is a 5 mm echogenic focus in the bladder. A recently passed
stone is a possibility.
2. There is a 1.4 cm cyst in the right adnexa which may arise from
the right ovary or represent a para ovarian simple cyst. Regardless,
it is of no significance.
3. No other abnormalities.
